# Patient Record
Sex: Female | Born: 1980 | Race: White | Hispanic: Yes | Marital: Married | Smoking: Never smoker
Health system: Southern US, Community
[De-identification: ages and names within clinical notes are randomized; demographics above are authoritative.]

## PROBLEM LIST (undated history)

## (undated) DIAGNOSIS — D219 Benign neoplasm of connective and other soft tissue, unspecified: Secondary | ICD-10-CM

## (undated) HISTORY — DX: Benign neoplasm of connective and other soft tissue, unspecified: D21.9

---

## 2011-06-05 ENCOUNTER — Ambulatory Visit
Admission: RE | Admit: 2011-06-05 | Discharge: 2011-06-05 | Disposition: A | Discharge status: Home / Self Care | Payer: Self-pay | Source: Ambulatory Visit | Attending: Geriatric Medicine | Admitting: Geriatric Medicine

## 2011-06-05 ENCOUNTER — Other Ambulatory Visit: Payer: Self-pay | Admitting: Geriatric Medicine

## 2011-06-05 DIAGNOSIS — N83209 Unspecified ovarian cyst, unspecified side: Secondary | ICD-10-CM

## 2011-10-24 ENCOUNTER — Ambulatory Visit (INDEPENDENT_AMBULATORY_CARE_PROVIDER_SITE_OTHER): Payer: Self-pay | Admitting: Gynecology

## 2011-10-24 ENCOUNTER — Encounter: Payer: Self-pay | Admitting: Gynecology

## 2011-10-24 ENCOUNTER — Other Ambulatory Visit (HOSPITAL_COMMUNITY)
Admission: RE | Admit: 2011-10-24 | Discharge: 2011-10-24 | Disposition: A | Discharge status: Home / Self Care | Payer: Self-pay | Source: Ambulatory Visit | Attending: Gynecology | Admitting: Gynecology

## 2011-10-24 VITALS — BP 116/70 | Ht 65.75 in | Wt 181.0 lb

## 2011-10-24 DIAGNOSIS — N979 Female infertility, unspecified: Secondary | ICD-10-CM

## 2011-10-24 DIAGNOSIS — Z01419 Encounter for gynecological examination (general) (routine) without abnormal findings: Secondary | ICD-10-CM | POA: Insufficient documentation

## 2011-10-24 DIAGNOSIS — Z124 Encounter for screening for malignant neoplasm of cervix: Secondary | ICD-10-CM

## 2011-10-24 MED ORDER — DOXYCYCLINE HYCLATE 50 MG PO CAPS: 100.0000 mg | ORAL_CAPSULE | Freq: Two times a day (BID) | ORAL | Status: AC

## 2011-10-24 NOTE — Progress Notes (Signed)
Patient is a 31 year old gravida 1 para 1 who presented to the office today as a result of 7 years of unprotected intercourse and unsuccessful in conceiving. Patient has been with the same partner for 7 years. She does have a child from a previous marriage and so does her husband. She states her cycles are every 28 days. She denies any dysmenorrhea or any menorrhagia. She denies any dyspareunia or any postcoital bleeding. 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 She denies any STDs in the past. She was seen at the urgent care last year for suspected ovarian cyst and it turned out that she has a very small fibroid 1.9 cm and normal ovaries. Patient cannot recall when was her last Pap smear and denies any prior abnormal studies. She does state that every few days her husband will drink approximately 8 beers in a day. Patient does have a BMI of 29.44 (weight 181 pound). She denies any unusual hair growth.  Exam: Abdomen soft nontender no rebound or guarding Pelvic: Bartholin urethra Skene was within normal limits Vagina: No gross lesions on inspection Cervix: No gross lesions on inspection Uterus: Slightly anteverted normal size shape and consistency Adnexa: No palpable masses or tenderness Rectal: Not examined  Pap smear was done today.  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 Patient we'll wait to start her next coming cycle to call the office so that we can schedule in the early proliferative phase an HSG. She will be prescribed Vibramycin 100 mg to take 1 by mouth twice a day for 3 days starting with the day prior to the procedure. We'll also schedule for her husband have a semen analysis on the same day and it was related to her to make sure that there 72 hours of abstinence before the test. There was in may conform to see me one week after the above tests to evaluate and plan a course of management. She  was instructed to start her prenatal vitamins. Literature information Spanish was provided on infertility and HSG. All questions answered we'll follow accordingly.

## 2011-10-24 NOTE — Patient Instructions (Signed)
Tiene que llamar a la oficina cuando le [place] periodo al [phone] y pedir por Claudia o [place] para que le haga la siguientes cita:  1. HSG en el hospital [facility] 2. Cita el mismo dia para que su esposo se haga el conteo de esperma 3. Cita para ver al Dr. Marton Redwood semana despues de los estudios  Recuerdese de tomar el antibiotico empesando el dia antes del HSG Tambien quiereo que empiese a tomar una vitamina prenatal diaria.  Infertilidad (Infertility) QU ES LA INFERTILIDAD?  La infertilidad normalmente se define como la incapacidad para quedar embarazada luego de un ao de relaciones sexuales regulares sin la utilizacin de mtodos anticonceptivos. O la incapacidad de llevar a trmino Chartered loss adjuster y Warehouse manager el beb. La tasa de infertilidad en los [place] es de alrededor del 10%. El [address] es el resultado de una cadena de sucesos. La mujer debe liberar el vulo de uno de sus ovarios (ovulacin). El vulo debe fertilizarse con el esperma. Luego viaja a travs de las trompas de [place]op[place] hacia el tero (matriz), donde se une a la pared del tero y crece. El hombre debe tener suficiente esperma y el esperma debe unirse con el vulo (fertilizar) en el momento justo. El vulo fertilizado debe luego unirse al interior del tero. Esto parece simple, pero pueden ocurrir [employer] cosas que evitan que el embarazo se produzca.  DE QUIN ES EL PROBLEMA?  Un 20% de los casos de infertilidad se deben a problemas del hombres (factores masculinos) y un 65% se debe a problemas de la mujer (factores femeninos). Otras causas pueden ser Neomia Dear combinacin de factores masculinos y femeninos o a causas desconocidas.  CULES SON LAS CAUSAS DE LA INFERTILIDAD EN EL HOMBRE?  La infertilidad en el hombre a menudo se debe a problemas para producir el esperma o hacer que el mismo llegue al vulo. Los problemas de esperma pueden existir desde el nacimiento o desarrollarse ms tarde debido a enfermedades o  lesiones. Algunos hombres no producen esperma, o producen muy poco (oligospermia). Entre otros problemas se incluyen:  Disfuncin sexual   Problemas hormonales o endocrinos.   La edad. La fertilidad del hombre disminuye con la edad, pero no tan temprano como la femenina.   Infecciones.   Problemas congnitos Defectos de nacimiento, como ausencia de los tubos que transportan el esperma (conductos deferentes).   Problemas genticos o de cromosomas.   Problemas de anticuerpos antiesperma.   Eyaculacin retrgrada (el esperma va hacia la vejiga).   Varicoceles, espermatoceles o tumores en los testculos.   El estilo de vida puede influir en el nmero y la calidad del esperma del hombre.   El alcohol y las drogas pueden reducir temporalmente la calidad del esperma.   Las toxinas [address], como los pesticidas y el plomo, pueden ocasionar algunos casos de infertilidad en hombres.  CULES SON LAS CAUSAS DE LA INFERTILIDAD EN LA MUJER?   La infertilidad en las mujeres est causada mayormente por problemas con la ovulacin. Sin ovulacin, el vulo no puede fertilizarse.   Seales de problemas de ovulacin son perodos menstruales irregulares o ningn perodo.   Factores simples del estilo de vida, como el estrs, la dieta, o el entrenamiento deportivo, pueden afectar el balance hormonal de Medical laboratory scientific officer.   La edad. La fertilidad comienza a IT consultant alrededor de los 30 aos y [place] a Glass blower/designer de los 37.   Mucho menos a menudo, un desequilibrio hormonal por un problema mdico serio como  un tumor en la glndula pituitaria, tiroides u otra enfermedad mdica crnica pueden ocasionar problemas de ovulacin.   Infecciones plvicas.   Sndrome de ovarios poliqusticos (aumento de hormonas masculinas, incapacidad de ovular).   Consumo de alcohol o drogas.   Toxinas ambientales, radiacin, pesticidas y ciertos qumicos.   La edad es un factor importante en la infertilidad  femenina.   La capacidad de los ovarios de una mujer para producir vulos disminuye con la edad, especialmente despus de los 35 aos. Alrededor de un tercio de las parejas en las que la mujer tiene ms de 35 aos tendr problemas de infertilidad.   En el momento en el que alcanza la [place], cuando su perodo menstrual se detiene, la mujer ya no puede producir vulos ni quedar embarazada.   Otros problemas tambin pueden llevar a la infertilidad en las mujeres. Si las trompas de [employer] estn bloqueadas en [employer] extremos, el vulo no puede pasar a travs de los tubos [place]. Tejido cicatrizal (adhesiones) en la pelvis que pueden obstruir las trompas. Esto puede dar como resultado una enfermedad inflamatoria plvica, endometriosis o una ciruga por embarazo ectpico (en la que el vulo fertilizado se ha implantado fuera del tero) o cualquier ciruga plvica o abdominal que ocasione adherencias.   Tumores fibroides o plipos en el tero.   Anormalidades congnitas (de nacimiento) del tero.   Infecciones en el cuello del tero (cervicitis).   Estenosis cervical (estrechamiento).   Mucosidad cervical anormal.   Sndrome poliqustico de los ovarios.   Tener relaciones sexuales muy seguidas (todos [place] o 4 a 5 veces por semana).   Obesidad.   Anorexia.   Dficit nutricional.   Mucho ejercicio, con prdida de grasa corporal.   DES. Su madre ha recibido la hormona dietilstilbesterol cuando estaba embarazada de usted.  CMO SE ANALIZA LA INFERTILIDAD?  Si ha estado tratando de quedar embarazada sin xito, podra querer buscar ayuda mdica. No debera esperar un ao de intentos sin xito antes de buscar ayuda profesional si:  Tiene mas de 35 aos de edad   Tiene razones para creer que podra tener problemas de fertilidad.  Un examen mdico determinar las causas de infertilidad de la pareja, Normalmente el proceso comienza con:  Exmenes fsicos   Historias  clnicas de ambos miembros de la pareja.   Historiales sexuales de ambos miembros de la pareja.  Si no hay un problema evidente, como relaciones sexuales en tiempos inadecuados o ausencia de ovulacin, se necesitarn Academic librarian.   Para el hombre, los anlisis normalmente comienzan con pruebas de semen para observar:   La cantidad de esperma.   La forma del esperma.   El movimiento del esperma.   Realizar un historial clnico y quirrgico completo.   Examen fsico.   Control de infecciones en los rganos reproductivos.  Podrn realizarle anlisis hormonales.   Para la mujer, el primer paso del anlisis es saber si ovula cada mes. Hay diferentes modos de Designer, industrial/product. Por ejemplo, puede llevar un registro de los cambios en la temperatura corporal por la maana y la textura de la mucosidad cervical. Otra herramienta es un kit de prueba de ovulacin casero, que puede comprarse en la farmacia.   Tambin pueden realizarse controles de ovulacin en el consultorio mdico, mediante anlisis de sangre para observar los niveles de hormonas o pruebas de [employer] ovarios. Si la mujer est ovulando, se necesitarn realizar ms exmenes. Algunas femeninas comunes incluyen:   Histerosalpingografa:  Se realizan rayos x de las trompas de Falopio y el tero con una inyeccin de [employer]. Mostrar si las trompas estn abiertas y la forma del tero.   Laparoscopa: Un anlisis de las trompas y otros rganos femeninos para Copywriter, advertising. Se utiliza un tubo con iluminacin llamado laparoscopio para observar el interior del abdomen.   Biopsia endometrial: Se toma una muestra del tejido del tero Film/video editor del perodo menstrual, para ver si el tejido le indica si est ovulando.   Prueba de ultrasonido transvaginal: Examina los rganos femeninos.   Histeroscopa: Utiliza un tubo con iluminacin para examinar el cuello del tero y el tero y ver si hay anormalidades dentro del  mismo.  TRATAMIENTO Dependiendo de los [employer], se sugerirn IT sales professional. El tratamiento depende de la causa. Entre el 85 y el 90% de los casos de infertilidad se tratan con drogas o [place].   Hay varias drogas para la fertilidad que pueden utilizarse para las mujeres con problemas de ovulacin. Es importante hablar con el profesional que la asiste sobre la droga a Chemical engineer. Deber comprender los beneficios y efectos colaterales de las drogas. Segn el tipo de droga para la fertilidad y la dosis [place], algunas mujeres podran tener embarazos mltiples (mellizos).   De ser [employer], se puede realizar una ciruga para reparar daos en los ovarios de la [place], trompas de [place], el cuello del tero o el tero.   Tratamiento quirrgico o mdico para la endometriosis o el sndrome de ovario poliqustico. A veces, los problemas de fertilidad en el hombre pueden corregirse con medicamentos o [place].   Inseminacin intrauterina, IUI, de esperma en concordancia con la ovulacin.   Cambios en el estilo de vida, si esta es la causa (prdida de [place], aumento de ejercicios, dejar de fumar, beber excesivamente o tomar drogas ilegales).   Otros tipos de ciruga:   Extirpar tumores por dentro o sobre el tero.   Eliminar tejido cicatrizal de dentro del tero.   Arreglar trompas obstruidas.   Eliminar tejido cicatrizal en la pelvis y alrededor de los rganos femeninos.  QU ES LA TECNOLOGA DE REPRODUCCIN ASISTIDA (ART)?  La tecnologa de reproduccin asistida (ART) utiliza mtodos especiales para ayudar a parejas infrtiles. En esta tcnica se manipula tanto el vulo de la mujer como el esperma del hombre. El xito depende de muchos factores. La ART puede ser cara y [address]. Pero ha hecho posible que muchas parejas tengan hijos que de otra manera no hubieran podido. A continuacin se enumeran algunos mtodos:  Fertilizacin. In Vitro (FIV). In Vitro  (IVF) es un procedimiento que se hizo famoso con el nacimiento en 1978 de [place], el primer "beb de probeta" del mundo. Se utiliza cuando las trompas de Nordstrom de la mujer estn bloqueadas o cuando el hombre tiene poco esperma. Se utiliza una droga para estimular los ovarios y producir mltiples vulos. Una vez maduros, los vulos se retiran y se Industrial/product designer en una placa de cultivo con el esperma del hombre para la fertilizacin. Luego de aproximadamente 40 horas, los vulos se examinan para ver si han sido fertilizados por el esperma y se han dividido en clulas. Esos vulos fertilizados (embriones) se colocan luego en el tero de la mujer. Esto evita el paso por las trompas de [place].   La transferencia intrafalopiana de gametas (GIFT) es similar al IVF, pero se utiliza cuando la mujer tiene al menos una trompa de falopio normal. Se colocan de tres a  cinco vulos en la trompa de Falop[place], junto con el esperma del hombre, para que la fertilizacin se realice dentro del cuerpo de Architectural technologist.   La transferencia intrafalopiana de cigotos (ZIFT), tambin se denomina transferencia embrionaria, y [place] el IVF con el GIFT. Los vulos retirados de los ovarios de la mujer se Land laboratorio y se Industrial/product designer en las trompas de [employer] en lugar de en el tero.   Los procedimientos de ART a menudo suponen la utilizacin de donantes de vulos (de [place] mujer) o de embriones congelados previamente. Los donantes de vulos pueden utilizarse si la mujer tiene [employer] ovarios o posee una enfermedad gentica que podra transmitirla al beb.   Cuando se realiza una ART hay mayor riesgo de embarazos mltiples, mellizos, trillizos o ms.   La inyeccin de esperma intracitoplasmtica es un procedimiento que inyecta un espermatozoide en el vulo para fertilizarlo.   El trasplante embrionario es un procedimiento que comienza con un embrin que se ha desarrollado en un medio especial (solucin qumica)  preparado para mantener el embrin vivo por 2 a 5 das, y Conservation officer, historic buildings.  En los [employer] que no puede encontrarse la causa y Firefighter no se produce, podr considerarse la adopcin. Document Released: 09/16/2007 Document Revised: 05/09/2011 [facility] Patient Information 2012 [place], [place].                                                                  Histerosalpingografa (HSG)(Hysterosalpingography)   La histerosalpingografa es una radiografa del tero y de las trompas de [place]. Se realiza para diagnosticar:  Problemas para quedar embarazada.   Prdidas continuas del embarazo(aborto espontneo).  ANTES DE LA PRUEBA  Programe el estudio entre el 5 y el 10 da del ltimo perodo. El da 1 es Film/video editor del perodo.   Consulte a su mdico si debe modificar o suspender los medicamentos.   Informe al mdico si est embarazada o si sufre una infeccin o una enfermedad de transmisin sexual.   Informe si sufre alergias al yodo o a las sustancias de [place] utilizadas para Optometrist.   Coma y beba normalmente.  PRUEBA  Deber recostarse en una camilla con los pies en la piernera.   Le colocarn un tubo en el canal cervical.   Inyectarn una sustancia de contraste dentro del tero a travs del tubo.   A medida que toman las radiografas, la harn [employer].   Despus de la prueba retirarn el tubo.  DESPUES DEL ESTUDIO  Eliminar la mayor parte del lquido de [place] natural.   Podr sentir clicos y Winferd Humphrey pequea prdida. Las [employer] deben desaparecer dentro de las 24 horas.   Tome slo la medicacin que le indic el mdico.   Consulte con su mdico la fecha en que los resultados estarn disponibles. Asegrese de [employer].  Document Released: 12/12/2010 Document Revised: 05/09/2011 [facility] Patient Information 2012 [place], [place].

## 2013-05-26 ENCOUNTER — Encounter: Payer: Self-pay | Admitting: Gynecology

## 2013-05-26 ENCOUNTER — Ambulatory Visit (INDEPENDENT_AMBULATORY_CARE_PROVIDER_SITE_OTHER): Payer: Self-pay | Admitting: Gynecology

## 2013-05-26 VITALS — BP 118/76 | Ht 65.75 in | Wt 182.0 lb

## 2013-05-26 DIAGNOSIS — N979 Female infertility, unspecified: Secondary | ICD-10-CM

## 2013-05-26 NOTE — Progress Notes (Signed)
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 Patient we'll wait to start her next coming cycle to call the office so that we can schedule in the early proliferative phase an HSG. She will be prescribed Vibramycin 100 mg to take 1 by mouth twice a day for 3 days starting with the day prior to the procedure. We'll also schedule for her husband have a semen analysis on the same day and it was related to her to make sure that there 72 hours of abstinence before the test. There was in may conform to see me one week after the above tests to evaluate and plan a course of management. She was instructed to start her prenatal vitamins. Literature information Spanish was provided on infertility and HSG.  Please see note entry 10/24/2011.  Patient has not had her HSG or semen analysis. They will schedule and return to the office for her formal consultation. Instructions on timing of intercourse was provided. She will stay on her prenatal vitamins. She is overdue for her annual exam.

## 2013-06-15 ENCOUNTER — Telehealth: Payer: Self-pay | Admitting: *Deleted

## 2013-06-15 DIAGNOSIS — IMO0002 Reserved for concepts with insufficient information to code with codable children: Secondary | ICD-10-CM

## 2013-06-15 NOTE — Telephone Encounter (Signed)
appt will be on 06/22/13 @ 2:00 pm claudia will informed patient with this. $825 up front cost this is not with radiologist fee.

## 2013-06-15 NOTE — Telephone Encounter (Signed)
Message copied by Aura Camps on Mon Jun 15, 2013  2:51 PM ------      Message from: Jerilynn Mages      Created: Mon Jun 15, 2013 12:12 PM      Regarding: hsg      Contact: [phone]       Please schedule HSG for JF patient. Her LMP 06-15-13. She is private pay and prefers afternoon appointment. I can call her with that info. Thx ------

## 2013-06-22 ENCOUNTER — Ambulatory Visit (HOSPITAL_COMMUNITY)
Admission: RE | Admit: 2013-06-22 | Discharge: 2013-06-22 | Disposition: A | Discharge status: Home / Self Care | Payer: Self-pay | Source: Ambulatory Visit | Attending: Gynecology | Admitting: Gynecology

## 2013-06-22 DIAGNOSIS — IMO0002 Reserved for concepts with insufficient information to code with codable children: Secondary | ICD-10-CM

## 2013-06-22 DIAGNOSIS — N979 Female infertility, unspecified: Secondary | ICD-10-CM | POA: Insufficient documentation

## 2013-06-22 MED ORDER — IOHEXOL 300 MG/ML  SOLN
20.0000 mL | Freq: Once | INTRAMUSCULAR | Status: AC | PRN
  Administered 2013-06-22: 71 mL

## 2013-06-26 ENCOUNTER — Telehealth: Payer: Self-pay

## 2013-06-26 NOTE — Telephone Encounter (Signed)
Patient called Ann Johnston (Spanish speaking patient) to inquire regarding her recent HSG results. Please advise.

## 2013-06-26 NOTE — Telephone Encounter (Signed)
Please inform patient that both of her tubes are blocked. Will need to sit down in consultation tomdiscuss options.

## 2013-06-29 NOTE — Telephone Encounter (Signed)
Passed this on to [place] so she can contact patient and let her know.

## 2013-06-30 NOTE — Telephone Encounter (Signed)
Ann Johnston informed patient and she is scheduled for consult on 07/07/13.

## 2013-07-07 ENCOUNTER — Encounter: Payer: Self-pay | Admitting: Gynecology

## 2013-07-07 ENCOUNTER — Ambulatory Visit (INDEPENDENT_AMBULATORY_CARE_PROVIDER_SITE_OTHER): Payer: Self-pay | Admitting: Gynecology

## 2013-07-07 VITALS — BP 128/80

## 2013-07-07 DIAGNOSIS — N971 Female infertility of tubal origin: Secondary | ICD-10-CM | POA: Insufficient documentation

## 2013-07-07 DIAGNOSIS — N979 Female infertility, unspecified: Secondary | ICD-10-CM

## 2013-07-07 NOTE — Patient Instructions (Addendum)
Infertilidad  (Infertility)  ¿QUÉ ES LA INFERTILIDAD?   La infertilidad normalmente se define como la incapacidad para quedar embarazada luego de un año de relaciones sexuales regulares sin la utilización de métodos anticonceptivos. O la incapacidad de llevar a término un embarazo y tener el bebé. La tasa de infertilidad en los [place] es de alrededor del 10%.  El embarazo es el resultado de una cadena de sucesos. La mujer debe liberar el óvulo de uno de sus ovarios (ovulación). El óvulo debe fertilizarse con el esperma. Luego viaja a través de las trompas de Falopio hacia el útero (matriz), donde se une a la pared del útero y crece. El hombre debe tener suficiente esperma y el esperma debe unirse con el óvulo (fertilizar) en el momento justo. El óvulo fertilizado debe luego unirse al interior del útero. Esto parece simple, pero pueden ocurrir muchas cosas que evitan que el embarazo se produzca.   ¿DE QUIÉN ES EL PROBLEMA?   Un 20% de los casos de infertilidad se deben a problemas del hombres (factores masculinos) y un 65% se debe a problemas de la mujer (factores femeninos). Otras causas pueden ser una combinación de factores masculinos y femeninos o a causas desconocidas.   ¿CUÁLES SON LAS CAUSAS DE LA INFERTILIDAD EN EL HOMBRE?   La infertilidad en el hombre a menudo se debe a problemas para producir el esperma o hacer que el mismo llegue al óvulo. Los problemas de esperma pueden existir desde el nacimiento o desarrollarse más tarde debido a enfermedades o lesiones. Algunos hombres no producen esperma, o producen muy poco (oligospermia). Entre otros problemas se incluyen:  · Disfunción sexual  · Problemas hormonales o endocrinos.  · La edad. La fertilidad del hombre disminuye con la edad, pero no tan temprano como la femenina.  · Infecciones.  · Problemas congénitos Defectos de nacimiento, como ausencia de los tubos que transportan el esperma (conductos deferentes).  · Problemas genéticos o de  cromosomas.  · Problemas de anticuerpos antiesperma.  · Eyaculación retrógrada (el esperma va hacia la vejiga).  · Varicoceles, espermatoceles o tumores en los testículos.  · El estilo de vida puede influir en el número y la calidad del esperma del hombre.  · El alcohol y las drogas pueden reducir temporalmente la calidad del esperma.  · Las toxinas ambientales, como los pesticidas y el plomo, pueden ocasionar algunos casos de infertilidad en hombres.  ¿CUÁLES SON LAS CAUSAS DE LA INFERTILIDAD EN LA MUJER?   · La infertilidad en las mujeres está causada mayormente por problemas con la ovulación. Sin ovulación, el óvulo no puede fertilizarse.  · Señales de problemas de ovulación son períodos menstruales irregulares o ningún período.  · Factores simples del estilo de vida, como el estrés, la dieta, o el entrenamiento deportivo, pueden afectar el balance hormonal de una persona.  · La edad. La fertilidad comienza a disminuir en las mujeres alrededor de los 30 años y empeora a partir de los 37.  · Mucho menos a menudo, un desequilibrio hormonal por un problema médico serio como un tumor en la glándula pituitaria, tiroides u otra enfermedad médica crónica pueden ocasionar problemas de ovulación.  · Infecciones pélvicas.  · Síndrome de ovarios poliquísticos (aumento de hormonas masculinas, incapacidad de ovular).  · Consumo de alcohol o drogas.  · Toxinas ambientales, radiación, pesticidas y ciertos químicos.  · La edad es un factor importante en la infertilidad femenina.  · La capacidad de los ovarios de una mujer para producir óvulos   disminuye con la edad, especialmente después de los 35 años. Alrededor de un tercio de las parejas en las que la mujer tiene más de 35 años tendrá problemas de infertilidad.  · En el momento en el que alcanza la menopausia, cuando su período menstrual se detiene, la mujer ya no puede producir óvulos ni quedar embarazada.  · Otros problemas también pueden llevar a la infertilidad en las  mujeres. Si las trompas de Falopio están bloqueadas en uno o los dos extremos, el óvulo no puede pasar a través de los tubos hacia el útero. Tejido cicatrizal (adhesiones) en la pelvis que pueden obstruir las trompas. Esto puede dar como resultado una enfermedad inflamatoria pélvica, endometriosis o una cirugía por embarazo ectópico (en la que el óvulo fertilizado se ha implantado fuera del útero) o cualquier cirugía pélvica o abdominal que ocasione adherencias.  · Tumores fibroides o pólipos en el útero.  · Anormalidades congénitas (de nacimiento) del útero.  · Infecciones en el cuello del útero (cervicitis).  · Estenosis cervical (estrechamiento).  · Mucosidad cervical anormal.  · Síndrome poliquístico de los ovarios.  · Tener relaciones sexuales muy seguidas (todos los días o 4 a 5 veces por semana).  · Obesidad.  · Anorexia.  · Déficit nutricional.  · Mucho ejercicio, con pérdida de grasa corporal.  · DES. Su madre ha recibido la hormona dietilstilbesterol cuando estaba embarazada de usted.  ¿CÓMO SE ANALIZA LA INFERTILIDAD?   Si ha estado tratando de quedar embarazada sin éxito, podría querer buscar ayuda médica. No debería esperar un año de intentos sin éxito antes de buscar ayuda profesional si:  · Tiene mas de 35 años de edad  · Tiene razones para creer que podría tener problemas de fertilidad.  Un examen médico determinará las causas de infertilidad de la pareja, Normalmente el proceso comienza con:  · Exámenes físicos  · Historias clínicas de ambos miembros de la pareja.  · Historiales sexuales de ambos miembros de la pareja.  Si no hay un problema evidente, como relaciones sexuales en tiempos inadecuados o ausencia de ovulación, se necesitarán realizar análisis.   · Para el hombre, los análisis normalmente comienzan con pruebas de semen para observar:  · La cantidad de esperma.  · La forma del esperma.  · El movimiento del esperma.  · Realizar un historial clínico y quirúrgico completo.  · Examen  físico.  · Control de infecciones en los órganos reproductivos.  Podrán realizarle análisis hormonales.   · Para la mujer, el primer paso del análisis es saber si ovula cada mes. Hay diferentes modos de realizar esto. Por ejemplo, puede llevar un registro de los cambios en la temperatura corporal por la mañana y la textura de la mucosidad cervical. Otra herramienta es un kit de prueba de ovulación casero, que puede comprarse en la farmacia.  · También pueden realizarse controles de ovulación en el consultorio médico, mediante análisis de sangre para observar los niveles de hormonas o pruebas de ultrasonido en los ovarios. Si la mujer está ovulando, se necesitarán realizar más exámenes. Algunas femeninas comunes incluyen:  · Histerosalpingografía: Se realizan rayos x de las trompas de Falopio y el útero con una inyección de contraste. Mostrará si las trompas están abiertas y la forma del útero.  · Laparoscopía: Un análisis de las trompas y otros órganos femeninos para encontrar enfermedades. Se utiliza un tubo con iluminación llamado laparoscopio para observar el interior del abdomen.  · Biopsia endometrial: Se toma una muestra del tejido del útero el   primer día del período menstrual, para ver si el tejido le indica si está ovulando.  · Prueba de ultrasonido transvaginal: Examina los órganos femeninos.  · Histeroscopía: Utiliza un tubo con iluminación para examinar el cuello del útero y el útero y ver si hay anormalidades dentro del mismo.  TRATAMIENTO  Dependiendo de los resultados de las pruebas, se sugerirán tratamientos diferentes. El tratamiento depende de la causa. Entre el 85 y el 90% de los casos de infertilidad se tratan con drogas o cirugía.   · Hay varias drogas para la fertilidad que pueden utilizarse para las mujeres con problemas de ovulación. Es importante hablar con el profesional que la asiste sobre la droga a utilizar. Deberá comprender los beneficios y efectos colaterales de las drogas. Según el  tipo de droga para la fertilidad y la dosis utilizada, algunas mujeres podrían tener embarazos múltiples (mellizos).  · De ser necesario, se puede realizar una cirugía para reparar daños en los ovarios de la mujer, trompas de Falopio, el cuello del útero o el útero.  · Tratamiento quirúrgico o médico para la endometriosis o el síndrome de ovario poliquístico. A veces, los problemas de fertilidad en el hombre pueden corregirse con medicamentos o cirugía.  · Inseminación intrauterina, IUI, de esperma en concordancia con la ovulación.  · Cambios en el estilo de vida, si esta es la causa (pérdida de peso, aumento de ejercicios, dejar de fumar, beber excesivamente o tomar drogas ilegales).  · Otros tipos de cirugía:  · Extirpar tumores por dentro o sobre el útero.  · Eliminar tejido cicatrizal de dentro del útero.  · Arreglar trompas obstruidas.  · Eliminar tejido cicatrizal en la pelvis y alrededor de los órganos femeninos.  ¿QUÉ ES LA TECNOLOGÍA DE REPRODUCCIÓN ASISTIDA (ART)?   La tecnología de reproducción asistida (ART) utiliza métodos especiales para ayudar a parejas infértiles. En esta técnica se manipula tanto el óvulo de la mujer como el esperma del hombre. El éxito depende de muchos factores. La ART puede ser cara y demandar mucho tiempo. Pero ha hecho posible que muchas parejas tengan hijos que de otra manera no hubieran podido. A continuación se enumeran algunos métodos:  · Fertilización. In Vitro (FIV). In Vitro (IVF) es un procedimiento que se hizo famoso con el nacimiento en 1978 de Louise Brown, el primer "bebé de probeta" del mundo. Se utiliza cuando las trompas de Falopio de la mujer están bloqueadas o cuando el hombre tiene poco esperma. Se utiliza una droga para estimular los ovarios y producir múltiples óvulos. Una vez maduros, los óvulos se retiran y se colocan en una placa de cultivo con el esperma del hombre para la fertilización. Luego de aproximadamente 40 horas, los óvulos se examinan para ver  si han sido fertilizados por el esperma y se han dividido en células. Esos óvulos fertilizados (embriones) se colocan luego en el útero de la mujer. Esto evita el paso por las trompas de Falopio.  · La transferencia intrafalopiana de gametas (GIFT) es similar al IVF, pero se utiliza cuando la mujer tiene al menos una trompa de falopio normal. Se colocan de tres a cinco óvulos en la trompa de Falopio, junto con el esperma del hombre, para que la fertilización se realice dentro del cuerpo de la mujer.  · La transferencia intrafalopiana de cigotos (ZIFT), también se denomina transferencia embrionaria, y combina el IVF con el GIFT. Los óvulos retirados de los ovarios de la mujer se fertilizan en el laboratorio y se colocan en las trompas de   Falopio en lugar de en el útero.  · Los procedimientos de ART a menudo suponen la utilización de donantes de óvulos (de otra mujer) o de embriones congelados previamente. Los donantes de óvulos pueden utilizarse si la mujer tiene problemas en los ovarios o posee una enfermedad genética que podría transmitirla al bebé.  · Cuando se realiza una ART hay mayor riesgo de embarazos múltiples, mellizos, trillizos o más.  · La inyección de esperma intracitoplasmática es un procedimiento que inyecta un espermatozoide en el óvulo para fertilizarlo.  · El trasplante embrionario es un procedimiento que comienza con un embrión que se ha desarrollado en un medio especial (solución química) preparado para mantener el embrión vivo por 2 a 5 días, y luego trasplantarlo en el útero.  En los casos en los que no puede encontrarse la causa y el embarazo no se produce, podrá considerarse la adopción.  Document Released: 09/16/2007 Document Revised: 11/19/2011  ExitCare® Patient Information ©2014 [employer].

## 2013-07-07 NOTE — Progress Notes (Signed)
Patient is a 32 year old gravida 1 para 1 who was seen in the office in February of this year. Patient stated for the past7 years of unprotected intercourse and unsuccessful in conceiving. Patient has been with the same partner for 7 years. She does have a child from a previous marriage and so does her husband. She states her cycles are every 28 days. She denies any dysmenorrhea or any menorrhagia. She denies any dyspareunia or any postcoital bleeding. 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 She denies any STDs in the past. She was seen at the urgent care last year for suspected ovarian cyst and it turned out that she has a very small fibroid 1.9 cm and normal ovaries.She does state that every few days her husband will drink approximately 8 beers in a day. Patient does have a BMI of 29.44 (weight 181 pound). She denies any unusual hair growth.  Patient presented with her husband today to discuss the results of the recent hysterosalpingogram. He has not had a semen analysis done as of yet.  HSG report from 06/22/2013 as follows:  FINDINGS:  The endometrial cavity was distended with contrast. There is  asymmetric dilatation of the left cornual aspect of the uterus. This  may be the result of mass effect of an adjacent fibroid. No  intrauterine filling defect identified. The bilateral fallopian  tubes are distended with contrast compatible with bilateral  hydrosalpinx. No free spillage of contrast compatible with tubal  occlusion at the level of the mid fallopian tube.  IMPRESSION:  1. 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  3. Mild asymmetric dilatation of the left cornual aspect of the  uterus likely secondary to mass effect from a fibroid. This can be  further evaluated with ultrasound as clinically indicated.  We discussed the above findings in Spanish in detail. I am going to recommend her to see my  reproductive endocrinology colleague Dr. Fermin Schwab. I believe (I will wait to hear his professional opinion) that this patient may be better served with in vitro fertilization because of her bilateral tubal occlusion and evidence of bilateral hydrosalpinx versus tubal cannulization/tuboplasty. I have recommended that they abstain from intercourse for 72 hours prior to the office visit with an in the event that he may want to proceed with the semen analysis at that time as well.

## 2013-07-08 ENCOUNTER — Telehealth: Payer: Self-pay | Admitting: *Deleted

## 2013-07-08 NOTE — Telephone Encounter (Signed)
Referral faxed to Dr.Yalcinkaya office, they will contact pt with time and date and inform me as well.

## 2013-07-09 NOTE — Telephone Encounter (Signed)
Appt. 08/10/13 @ 1:00 pm

## 2014-05-27 ENCOUNTER — Encounter: Payer: Self-pay | Admitting: Gynecology

## 2014-07-12 ENCOUNTER — Encounter: Payer: Self-pay | Admitting: Gynecology

## 2015-06-23 ENCOUNTER — Encounter: Payer: Self-pay | Admitting: Gynecology

## 2015-06-23 ENCOUNTER — Ambulatory Visit (INDEPENDENT_AMBULATORY_CARE_PROVIDER_SITE_OTHER): Payer: BLUE CROSS/BLUE SHIELD | Admitting: Gynecology

## 2015-06-23 VITALS — BP 128/74 | Ht 66.0 in | Wt 176.0 lb

## 2015-06-23 DIAGNOSIS — D251 Intramural leiomyoma of uterus: Secondary | ICD-10-CM | POA: Diagnosis not present

## 2015-06-23 DIAGNOSIS — N898 Other specified noninflammatory disorders of vagina: Secondary | ICD-10-CM | POA: Diagnosis not present

## 2015-06-23 DIAGNOSIS — Z23 Encounter for immunization: Secondary | ICD-10-CM | POA: Diagnosis not present

## 2015-06-23 DIAGNOSIS — N979 Female infertility, unspecified: Secondary | ICD-10-CM | POA: Diagnosis not present

## 2015-06-23 DIAGNOSIS — N971 Female infertility of tubal origin: Secondary | ICD-10-CM | POA: Diagnosis not present

## 2015-06-23 DIAGNOSIS — N941 Unspecified dyspareunia: Secondary | ICD-10-CM | POA: Diagnosis not present

## 2015-06-23 LAB — WET PREP FOR TRICH, YEAST, CLUE
CLUE CELLS WET PREP: NONE SEEN
TRICH WET PREP: NONE SEEN
Yeast Wet Prep HPF POC: NONE SEEN

## 2015-06-23 MED ORDER — DOXYCYCLINE HYCLATE 100 MG PO CAPS: 100.0000 mg | ORAL_CAPSULE | Freq: Two times a day (BID) | ORAL | Status: AC

## 2015-06-23 NOTE — Patient Instructions (Signed)
Histerosalpingografa (Hysterosalpingography) La histerosalpingografa es un procedimiento para observar el interior del tero y las trompas de [place]. Durante este procedimiento, se inyecta una sustancia de contraste en el tero, a travs de la vagina y el cuello del tero para poder visualizar el tero mientras se toman imgenes radiogrficas. Este procedimiento puede ayudar al mdico a diagnosticar tumores, adherencias o anormalidades estructurales en el tero. Generalmente, se [place] para determinar las razones por las que una mujer no puede tener hijos (infertilidad). El procedimiento suele durar entre 15 y 59 minutos. INFORME A SU MDICO:  Cualquier alergia que tenga.  Todos los [employer], incluidos vitaminas, hierbas, gotas oftlmicas, cremas y medicamentos de venta libre.  Problemas previos que usted o los [employer] de su familia hayan tenido con el uso de anestsicos.  Enfermedades de [employer].  Cirugas previas.  Afecciones mdicas que tenga. RIESGOS Y COMPLICACIONES  En general, se trata de un procedimiento seguro. Sin embargo, Engineer, technical sales, pueden surgir problemas. Estos son algunos posibles problemas:  Infeccin en el revestimiento interior del tero (endometritis) o en las trompas de Falopio (salpingitis).  [place] trompas de [place].  Reaccin alrgica a la sustancia de [place] para tomar la radiografa. ANTES DEL PROCEDIMIENTO   Debe planificar el procedimiento para despus que finalice su perodo, pero antes de su prxima ovulacin. Esto ocurre por lo general [employer] 5 y 32 de su ltimo perodo. El Da 1 es Engineer, manufacturing systems de su perodo.  Consulte a su mdico si debe cambiar o suspender los medicamentos que toma habitualmente.  Podr comer y beber normalmente.  Antes de comenzar el procedimiento, vace la vejiga. PROCEDIMIENTO  Es posible que le administren un medicamento para Nurse, children's  (sedante) o un analgsico de venta libre para Public house manager las molestias durante el procedimiento.  Deber acostarse en una mesa de radiografas con los pies en los estribos.  Le colocarn en la vagina un dispositivo llamado espculo. Esto permite al mdico observar el interior de la vagina hasta el cuello del tero.  El cuello del tero se lava con un jabn especial.  Luego se pasa un tubo delgado y flexible a travs del cuello del tero hasta el tero.  Por el tubo se colocar una sustancia de [place].  Le tomarn varias radiografas a medida que el contraste pasa a travs del tero y las trompas de [place].  Despus del procedimiento se retira el tubo. DESPUS DEL PROCEDIMIENTO   La mayor parte de la sustancia de contraste se eliminar de la vagina naturalmente. Puede ser necesario que use un apsito sanitario.  Puede sentir clicos leves y notar una hemorragia vaginal leve. Luego de 24 horas deben desaparecer.  Pregunte cundo [employer]. Asegrese de [employer].   Esta informacin no tiene Marine scientist el consejo del mdico. Asegrese de hacerle al mdico cualquier pregunta que tenga.   Document Released: 11/19/2011 Document Revised: 09/01/2013 Elsevier Interactive Patient Education [employer].

## 2015-06-23 NOTE — Progress Notes (Signed)
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 The last time she was seen the office was in 07/07/2013 and the following was documented:  Patient is a gravida 1 para 1 who now for the past 11 years has been unsuccessful in conceiving has been with the same partner. Both her and her current partner has had children with different partners.She states her cycles are every 28 days. She denies any dysmenorrhea or any menorrhagia. She denies any dyspareunia or any postcoital bleeding. 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 She denies any STDs in the past.patient stated that her husband drinks proximally beers per day. Patient back in 2014 had an HSG which demonstrated the following:  FINDINGS:  The endometrial cavity was distended with contrast. There is  asymmetric dilatation of the left cornual aspect of the uterus. This  may be the result of mass effect of an adjacent fibroid. No  intrauterine filling defect identified. The bilateral fallopian  tubes are distended with contrast compatible with bilateral  hydrosalpinx. No free spillage of contrast compatible with tubal  occlusion at the level of the mid fallopian tube.  IMPRESSION:  1. 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  3. Mild asymmetric dilatation of the left cornual aspect of the  uterus likely secondary to mass effect from a fibroid. This can be  further evaluated with ultrasound as clinically indicated.   She had been referred to the reproductive endocrinologist Dr. Kerin Perna but did not follow through because she did not have insurance. 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 There was no intraoperative  chromopertubation. There was a description her normal-appearing tubes and ovary.  Patient states some time she has vaginal discomfort during intercourse. She denies any postcoital bleeding. She stated she had a Pap smear this year right before her surgery. She reports that it was benign.  Exam: Abdomen: Soft nontender no rebound or guarding Pelvic: Bartholin urethra Skene was within normal limits vagina clear-like fluid was noted in the vagina wet prep was done which was negative Cervix: No lesions or discharge Uterus: Anteverted normal size shape and consistency Adnexa: No palpable masses or tenderness Rectal exam: Not done  Assessment/plan: 34 year old gravida 1 para 1 with secondary infertility. Evaluation 2014 demonstrated bilateral tubal occlusion. At another facility 5 months ago she had abdominal myomectomy whereby the intrauterine cavity had been entered for which patient is aware that she is a candidate for cesarean section were she to conceive in the near future. She is on day 6 of her cycle we are going to schedule an HSG to see if perhaps the fibroids were removed were compressing near the fallopian tubes and to see if tubal patency is present or not. If tubal patency is not documented and obstruction still present I am going to recommended once again that she see the reproductive endocrinologist for in vitro fertilization. If tubes are patent we'll give her trial of ovulation induction medication with timing of intercourse and or intrauterine insemination. We will need to get an updated semen analysis as well. She will be prescribed Vibramycin 100 mg 1 by mouth twice a day starting the day before the HSG for 3 days for prophylaxis. All the above instructions were provided  in [place].

## 2015-06-23 NOTE — Addendum Note (Signed)
Addended by: Thurnell Garbe A on: 06/23/2015 03:14 PM   Modules accepted: Orders

## 2015-07-05 ENCOUNTER — Other Ambulatory Visit: Payer: Self-pay | Admitting: Anesthesiology

## 2015-07-05 ENCOUNTER — Other Ambulatory Visit: Payer: Self-pay

## 2015-07-13 ENCOUNTER — Telehealth: Payer: Self-pay | Admitting: *Deleted

## 2015-07-13 DIAGNOSIS — N979 Female infertility, unspecified: Secondary | ICD-10-CM

## 2015-07-13 NOTE — Telephone Encounter (Signed)
Appointment on 07/19/15 @ 2:00pm check in at 1:30pm $802.00 at [facility] hospital. Rosemarie Ax will inform patient.

## 2015-07-13 NOTE — Telephone Encounter (Signed)
-----   Message from Sinclair Grooms sent at 07/13/2015  9:03 AM EDT ----- Regarding: HSG Contact: [phone] Please schedule HSG for JF patient her LMP 07-12-15. Patient prefers afternoon appointment. She does not have insurance so can you please provide amount she needs to take and also give my please that phone #. Thx

## 2015-07-19 ENCOUNTER — Other Ambulatory Visit: Payer: Self-pay | Admitting: Gynecology

## 2015-07-19 ENCOUNTER — Ambulatory Visit (HOSPITAL_COMMUNITY)
Admission: RE | Admit: 2015-07-19 | Discharge: 2015-07-19 | Disposition: A | Discharge status: Home / Self Care | Payer: BLUE CROSS/BLUE SHIELD | Source: Ambulatory Visit | Attending: Gynecology | Admitting: Gynecology

## 2015-07-19 DIAGNOSIS — Z9851 Tubal ligation status: Secondary | ICD-10-CM | POA: Insufficient documentation

## 2015-07-19 DIAGNOSIS — N979 Female infertility, unspecified: Secondary | ICD-10-CM

## 2015-07-19 MED ORDER — IOHEXOL 300 MG/ML  SOLN
30.0000 mL | Freq: Once | INTRAMUSCULAR | Status: DC | PRN
  Administered 2015-07-19: 30 mL
  Filled 2015-07-19: qty 30

## 2017-01-23 ENCOUNTER — Encounter: Payer: Self-pay | Admitting: Gynecology

## 2017-06-14 IMAGING — RF DG HYSTEROGRAM
1 series · 1 of 1 positions shown · IV contrast (omnipaque)
Comparison: 06/22/2013

CLINICAL DATA: Infertility. Bilateral tubal occlusion noted on
prior HSG. Recent myomectomy.

EXAM:
HYSTEROSALPINGOGRAM
TECHNIQUE: Following cleansing of the cervix and vagina with Betadine solution,
a hysterosalpingogram was performed using a 5-French
hysterosalpingogram catheter and Omnipaque 300 contrast. The patient
tolerated the examination without difficulty.

[Series 1: run · 1 of 1 slices shown]
[im 1/1]
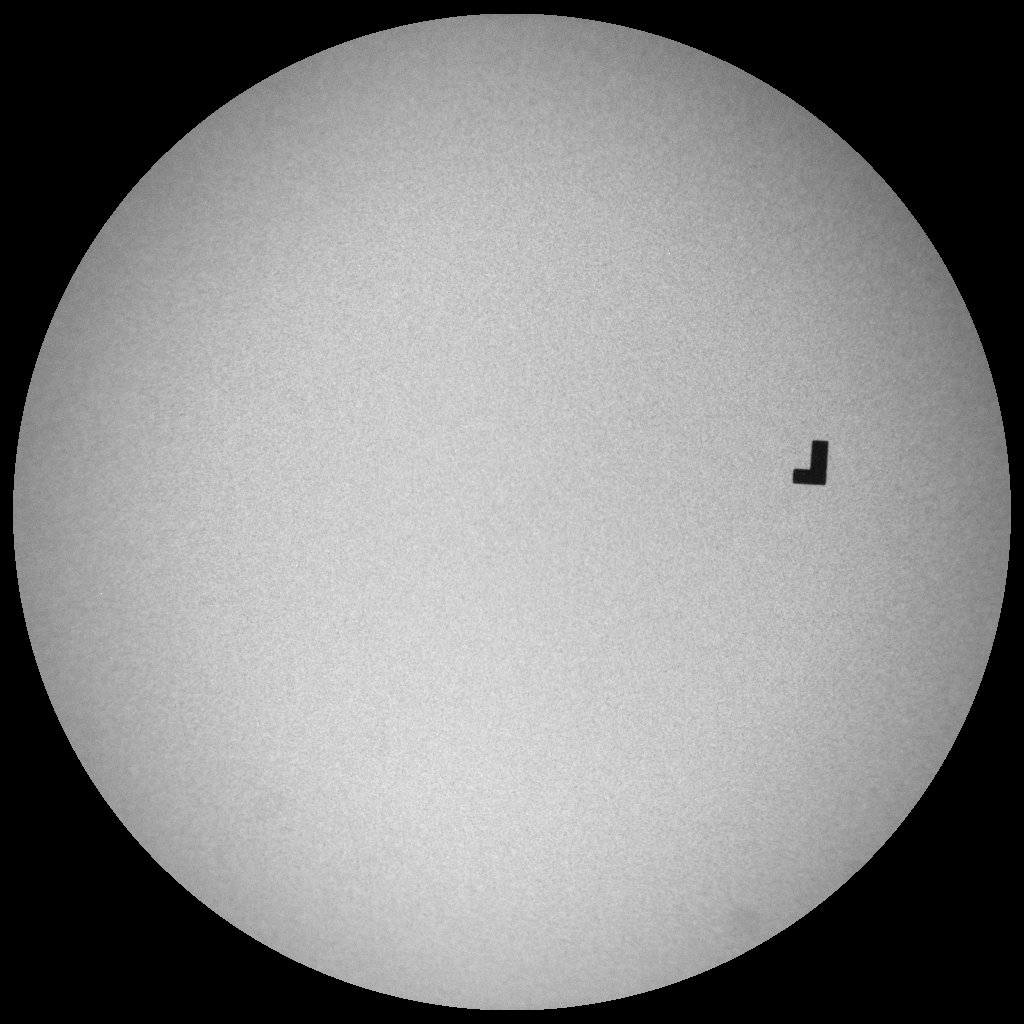

[1 of 1 positions shown; findings below may reference images not displayed]

FLUOROSCOPY TIME:  Radiation Exposure Index (as provided by the
fluoroscopic device):

If the device does not provide the exposure index:

Fluoroscopy Time:  1 minutes 12 seconds

Number of Acquired Images:  0
FINDINGS: Multiple attempts were made to placed a catheter into the uterine
cavity. However, the cervix was tilted severely posteriorly.
Multiple attempts were made but were unsuccessful at passing the
catheter through the cervix and into the endometrial canal.
IMPRESSION: Unsuccessful attempted HSG due to severe posterior angulation of the
cervix.
